# Patient Record
Sex: Male | Born: 1965 | Race: Black or African American | Hispanic: No | Marital: Married | Smoking: Never smoker
Health system: Southern US, Community
[De-identification: ages and names within clinical notes are randomized; demographics above are authoritative.]

## PROBLEM LIST (undated history)

## (undated) DIAGNOSIS — H11009 Unspecified pterygium of unspecified eye: Secondary | ICD-10-CM

## (undated) HISTORY — PX: HEMORROIDECTOMY: SUR656

## (undated) HISTORY — PX: EYE SURGERY: SHX253

## (undated) HISTORY — PX: PTERYGIUM EXCISION: SHX2273

---

## 2008-08-17 ENCOUNTER — Ambulatory Visit: Payer: Self-pay | Admitting: Internal Medicine

## 2008-08-17 DIAGNOSIS — R141 Gas pain: Secondary | ICD-10-CM

## 2008-08-17 DIAGNOSIS — K644 Residual hemorrhoidal skin tags: Secondary | ICD-10-CM | POA: Insufficient documentation

## 2008-08-17 DIAGNOSIS — R1084 Generalized abdominal pain: Secondary | ICD-10-CM

## 2008-08-17 DIAGNOSIS — R142 Eructation: Secondary | ICD-10-CM

## 2008-08-17 DIAGNOSIS — K5900 Constipation, unspecified: Secondary | ICD-10-CM | POA: Insufficient documentation

## 2008-08-17 DIAGNOSIS — R143 Flatulence: Secondary | ICD-10-CM

## 2008-08-17 DIAGNOSIS — K922 Gastrointestinal hemorrhage, unspecified: Secondary | ICD-10-CM | POA: Insufficient documentation

## 2008-09-01 ENCOUNTER — Ambulatory Visit: Payer: Self-pay | Admitting: Internal Medicine

## 2009-04-30 ENCOUNTER — Ambulatory Visit: Payer: Self-pay | Admitting: Internal Medicine

## 2009-04-30 DIAGNOSIS — K625 Hemorrhage of anus and rectum: Secondary | ICD-10-CM

## 2009-05-16 ENCOUNTER — Emergency Department (HOSPITAL_COMMUNITY): Admission: EM | Admit: 2009-05-16 | Discharge: 2009-05-16 | Payer: Self-pay | Admitting: Emergency Medicine

## 2009-06-21 ENCOUNTER — Encounter: Admission: RE | Admit: 2009-06-21 | Discharge: 2009-06-21 | Payer: Self-pay | Admitting: Internal Medicine

## 2010-07-28 IMAGING — CR DG LUMBAR SPINE 2-3V
3 series · 3 of 3 positions shown · non-contrast
Comparison: None

CLINICAL DATA: Low back pain for 5 days, no acute injury

LUMBAR SPINE - 2-3 VIEW

[view not recorded (1 of 3)]
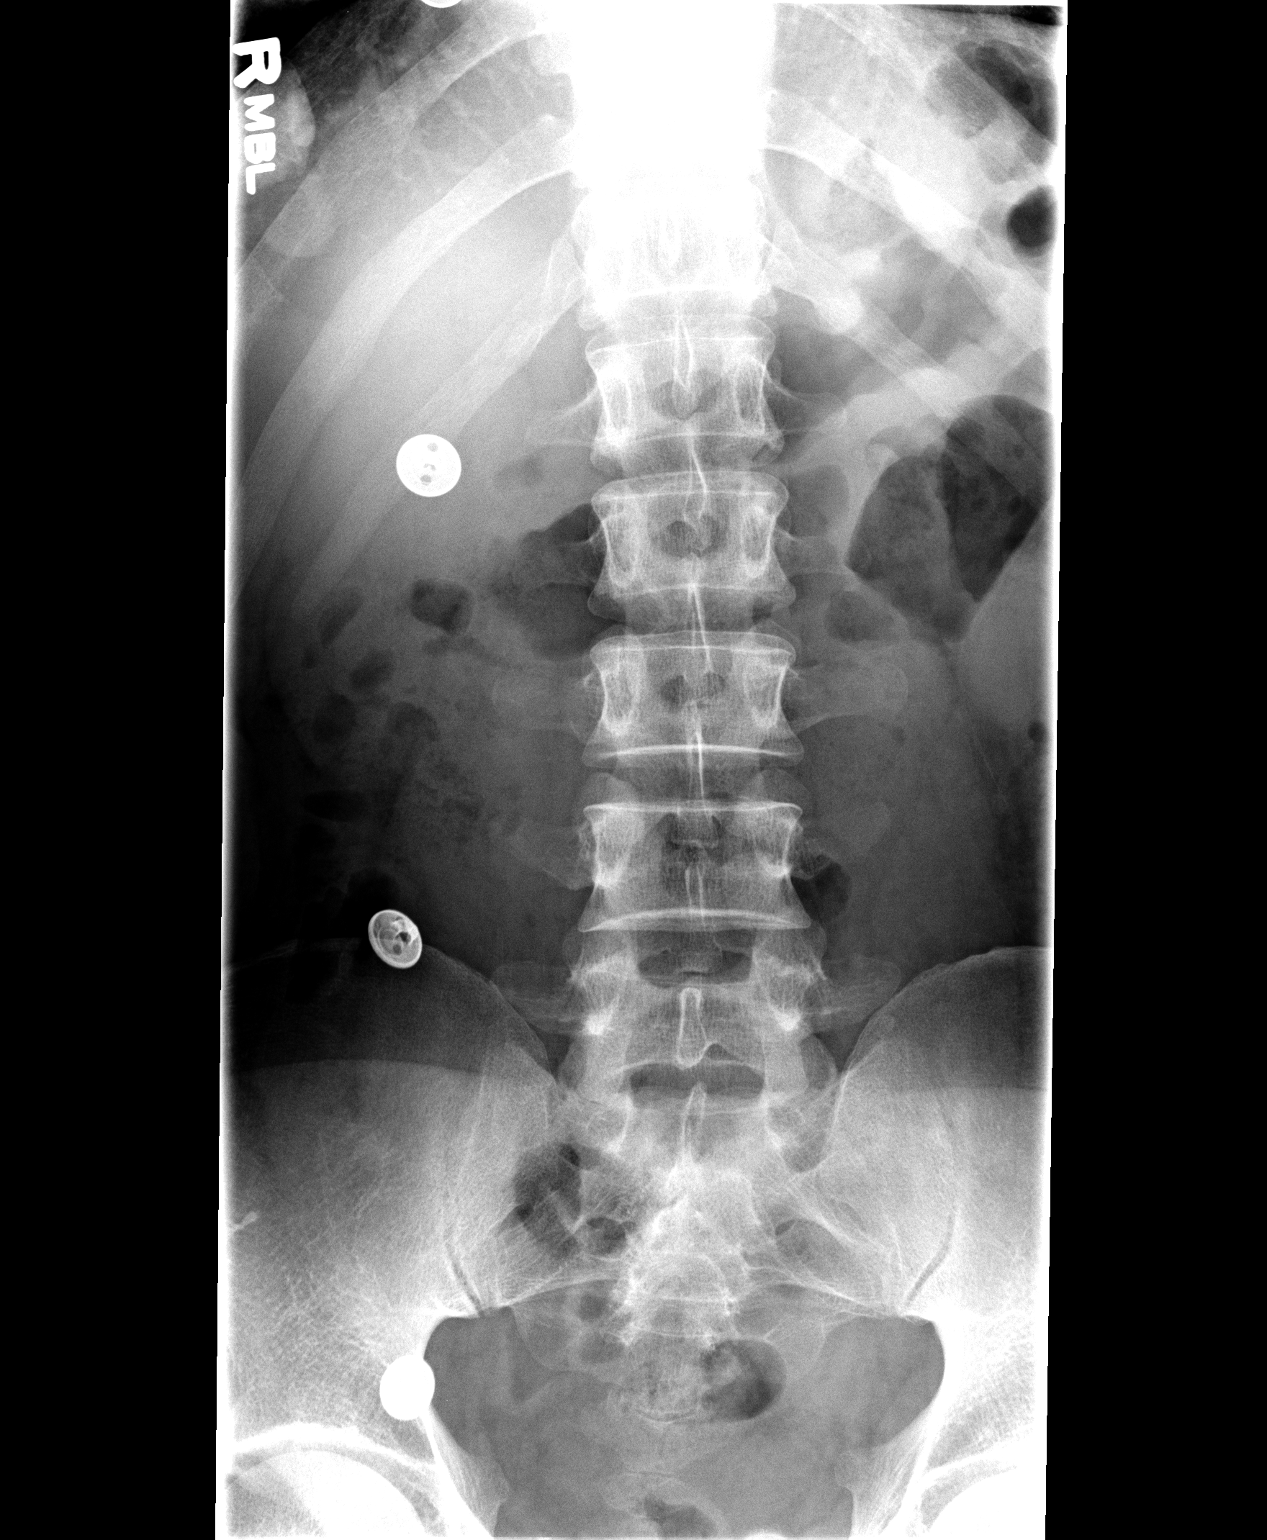

[view not recorded (2 of 3)]
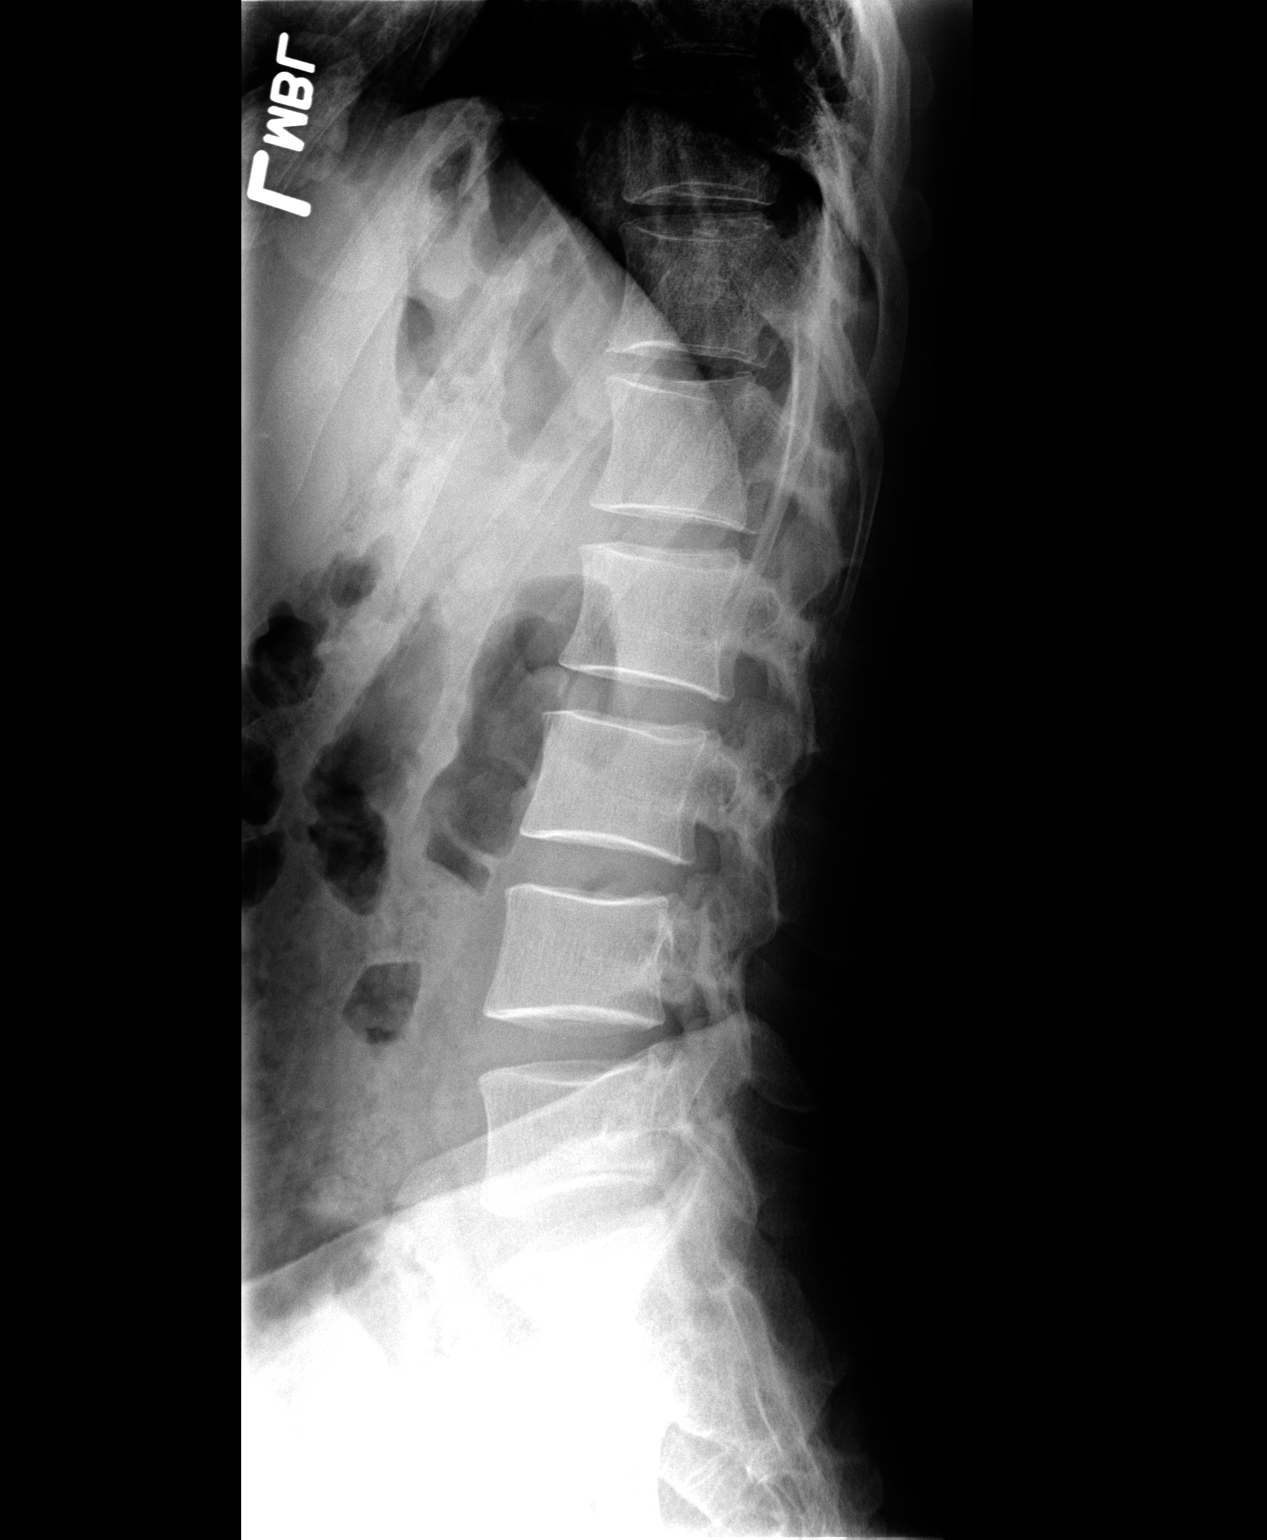

[view not recorded (3 of 3)]
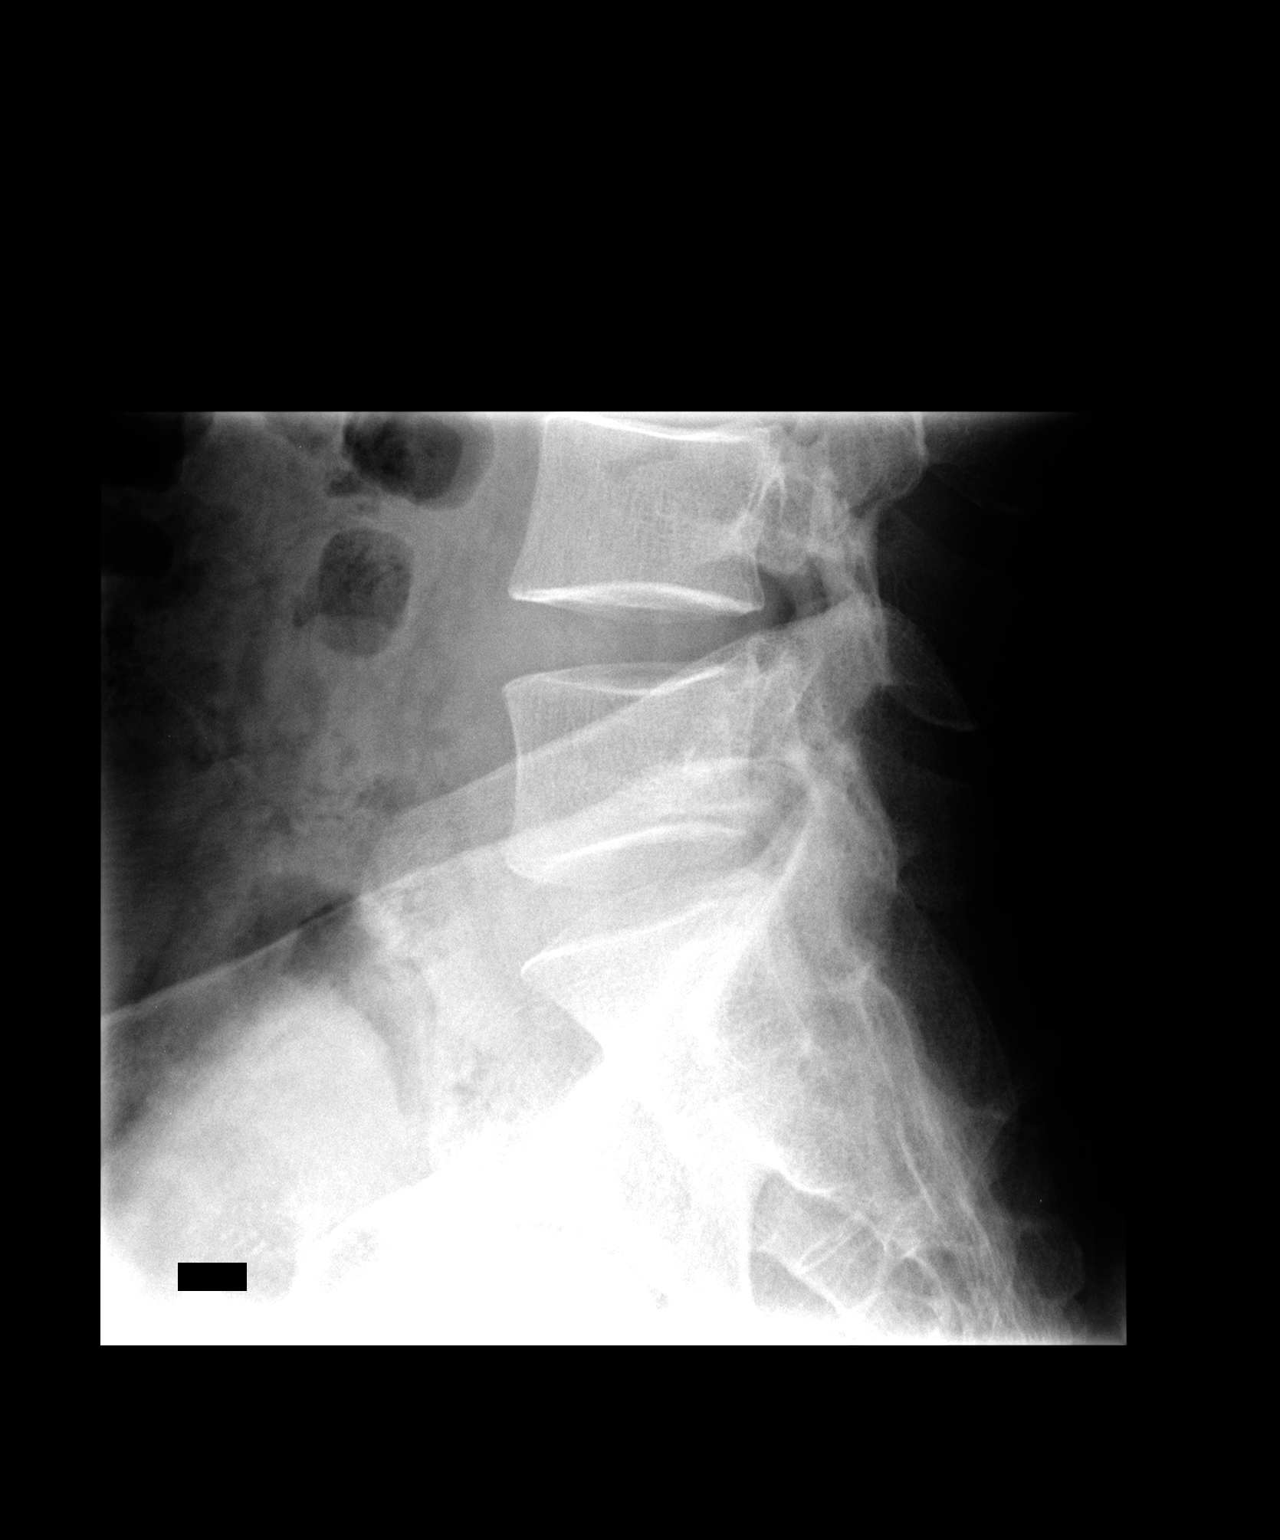

[3 of 3 positions shown; findings below may reference images not displayed]

FINDINGS: The lumbar vertebrae are in normal alignment with normal
intervertebral disc spaces.  No compression deformity is seen.  The
SI joints appear normally corticated.
IMPRESSION: Normal alignment with no acute abnormality.

## 2011-01-15 LAB — CBC
HCT: 44.8 % (ref 39.0–52.0)
MCV: 83.4 fL (ref 78.0–100.0)
Platelets: 172 10*3/uL (ref 150–400)
RDW: 14 % (ref 11.5–15.5)

## 2011-01-15 LAB — POCT I-STAT, CHEM 8
HCT: 48 % (ref 39.0–52.0)
Hemoglobin: 16.3 g/dL (ref 13.0–17.0)
Sodium: 140 mEq/L (ref 135–145)
TCO2: 29 mmol/L (ref 0–100)

## 2011-01-15 LAB — DIFFERENTIAL
Basophils Absolute: 0 10*3/uL (ref 0.0–0.1)
Basophils Relative: 1 % (ref 0–1)
Eosinophils Relative: 7 % — ABNORMAL HIGH (ref 0–5)
Monocytes Absolute: 0.6 10*3/uL (ref 0.1–1.0)
Neutro Abs: 1.9 10*3/uL (ref 1.7–7.7)

## 2011-01-15 LAB — POCT URINALYSIS DIP (DEVICE)
Protein, ur: NEGATIVE mg/dL
Urobilinogen, UA: 0.2 mg/dL (ref 0.0–1.0)

## 2013-04-22 ENCOUNTER — Other Ambulatory Visit: Payer: Self-pay | Admitting: Ophthalmology

## 2013-04-23 ENCOUNTER — Emergency Department (HOSPITAL_COMMUNITY)
Admission: EM | Admit: 2013-04-23 | Discharge: 2013-04-23 | Disposition: A | Discharge status: Home / Self Care | Payer: Managed Care, Other (non HMO) | Attending: Emergency Medicine | Admitting: Emergency Medicine

## 2013-04-23 ENCOUNTER — Encounter (HOSPITAL_COMMUNITY): Payer: Self-pay | Admitting: *Deleted

## 2013-04-23 DIAGNOSIS — Z79899 Other long term (current) drug therapy: Secondary | ICD-10-CM | POA: Insufficient documentation

## 2013-04-23 DIAGNOSIS — H53149 Visual discomfort, unspecified: Secondary | ICD-10-CM | POA: Insufficient documentation

## 2013-04-23 DIAGNOSIS — Z9889 Other specified postprocedural states: Secondary | ICD-10-CM | POA: Insufficient documentation

## 2013-04-23 DIAGNOSIS — IMO0002 Reserved for concepts with insufficient information to code with codable children: Secondary | ICD-10-CM | POA: Insufficient documentation

## 2013-04-23 DIAGNOSIS — R55 Syncope and collapse: Secondary | ICD-10-CM | POA: Insufficient documentation

## 2013-04-23 DIAGNOSIS — R42 Dizziness and giddiness: Secondary | ICD-10-CM | POA: Insufficient documentation

## 2013-04-23 DIAGNOSIS — H5789 Other specified disorders of eye and adnexa: Secondary | ICD-10-CM | POA: Insufficient documentation

## 2013-04-23 DIAGNOSIS — Z8669 Personal history of other diseases of the nervous system and sense organs: Secondary | ICD-10-CM | POA: Insufficient documentation

## 2013-04-23 HISTORY — DX: Unspecified pterygium of unspecified eye: H11.009

## 2013-04-23 LAB — GLUCOSE, CAPILLARY: Glucose-Capillary: 93 mg/dL (ref 70–99)

## 2013-04-23 LAB — POCT I-STAT, CHEM 8
Calcium, Ion: 1.22 mmol/L (ref 1.12–1.23)
Chloride: 100 mEq/L (ref 96–112)
Glucose, Bld: 94 mg/dL (ref 70–99)
HCT: 49 % (ref 39.0–52.0)
Hemoglobin: 16.7 g/dL (ref 13.0–17.0)

## 2013-04-23 MED ORDER — IBUPROFEN 200 MG PO TABS
600.0000 mg | ORAL_TABLET | Freq: Once | ORAL | Status: AC
  Administered 2013-04-23: 600 mg via ORAL
  Filled 2013-04-23: qty 3

## 2013-04-23 NOTE — ED Notes (Signed)
Patient resting.  Denies any complaints except for being hungry.  Will inform MD

## 2013-04-23 NOTE — ED Notes (Signed)
Patient was at [facility] md for follow up today post pterygium left eye surgery.  He had MAC anesthesia for surgery on yesterday.  Patient went to MD as scheduled for follow up and had onset of feeling light headed and had a brief period of loc.  Patient was given soda and asprin 325mg .  Patient cbg 90 per ems.  bp 123/83.  No ortho changes reported.  Patient has baseline of bradycardia.  Robert Simpson

## 2013-04-23 NOTE — ED Provider Notes (Signed)
History    CSN: [id number] Arrival date & time 04/23/13  1114  First MD Initiated Contact with Patient 04/23/13 1131     Chief Complaint  Patient presents with  . Loss of Consciousness   (Consider location/radiation/quality/duration/timing/severity/associated sxs/prior Treatment) HPI Pt with pterygium surgery yesterday and was following up with Ophthalmologist today when he had syncopal episode proceeded by lightheadedness. Pt was amnestic to the event. No head or neck trauma. No CP, SOB, focal weakness or numbness. Pt states he feels at his baseline currently. He had similar episode 20 year ago.  Past Medical History  Diagnosis Date  . Pterygium    Past Surgical History  Procedure Laterality Date  . Pterygium excision     No family history on file. History  Substance Use Topics  . Smoking status: Never Smoker   . Smokeless tobacco: Not on file  . Alcohol Use: No    Review of Systems  Constitutional: Negative for fever and chills.  HENT: Negative for neck pain.   Eyes: Positive for photophobia and redness.  Respiratory: Negative for shortness of breath.   Cardiovascular: Negative for chest pain, palpitations and leg swelling.  Gastrointestinal: Negative for nausea, vomiting and abdominal pain.  Musculoskeletal: Negative for myalgias and back pain.  Skin: Negative for wound.  Neurological: Positive for dizziness, syncope and light-headedness. Negative for seizures, weakness, numbness and headaches.  All other systems reviewed and are negative.    Allergies  Review of patient's allergies indicates no known allergies.  Home Medications   Current Outpatient Rx  Name  Route  Sig  Dispense  Refill  . cholecalciferol (VITAMIN D) 1000 UNITS tablet   Oral   Take 1,000 Units by mouth daily.         Marland Kitchen HYDROcodone-acetaminophen (NORCO/VICODIN) 5-325 MG per tablet   Oral   Take 1 tablet by mouth every 6 (six) hours as needed for pain.         Marland Kitchen moxifloxacin  (VIGAMOX) 0.5 % ophthalmic solution   Left Eye   Place 1 drop into the left eye 4 (four) times daily - after meals and at bedtime.         . prednisoLONE sodium phosphate (INFLAMASE FORTE) 1 % ophthalmic solution   Left Eye   Place 1 drop into the left eye 4 (four) times daily.         Marland Kitchen tobramycin (TOBREX) 0.3 % ophthalmic ointment   Left Eye   Place 1 application into the left eye at bedtime.          BP 119/75  Pulse 60  Temp(Src) 98.3 F (36.8 C) (Oral)  Resp 20  Ht 6\' 2"  (1.88 m)  Wt 163 lb (73.936 kg)  BMI 20.92 kg/m2  SpO2 99% Physical Exam  Nursing note and vitals reviewed. Constitutional: He is oriented to person, place, and time. He appears well-developed and well-nourished. No distress.  HENT:  Head: Normocephalic and atraumatic.  Mouth/Throat: Oropharynx is clear and moist.  Eyes: EOM are normal. Pupils are equal, round, and reactive to light.  Injected L eye, with direct and consensual photophobia.   Neck: Normal range of motion. Neck supple.  No posterior cervical tenderness  Cardiovascular: Normal rate and regular rhythm.   Pulmonary/Chest: Effort normal and breath sounds normal. No respiratory distress. He has no wheezes. He has no rales.  Abdominal: Soft. Bowel sounds are normal. He exhibits no distension and no mass. There is no tenderness. There is no rebound and no  guarding.  Musculoskeletal: Normal range of motion. He exhibits no edema and no tenderness.  No calf swelling or tenderness  Neurological: He is alert and oriented to person, place, and time.  5/5 motor in all ext, sensation intact  Skin: Skin is warm and dry. No rash noted. No erythema.  Psychiatric: He has a normal mood and affect. His behavior is normal.    ED Course  Procedures (including critical care time) Labs Reviewed  GLUCOSE, CAPILLARY  TROPONIN I  POCT I-STAT, CHEM 8  POCT I-STAT TROPONIN I   No results found. 1. Syncope     Date: 04/23/2013  Rate: 50  Rhythm:  sinus bradycardia  QRS Axis: normal  Intervals: normal  ST/T Wave abnormalities: early repolarization and diffuse ST seg elevation consistent with J point elevation vs pericarditis.   Conduction Disutrbances:none  Narrative Interpretation:   Old EKG Reviewed: none available   MDM  Discussed with cardiology who reviewed EKG. Think pattern is more consistent with early repol type pattern. Recommend out pt f/u with no acute intervention.   Loren Racer, MD 04/23/13 1537

## 2016-02-07 ENCOUNTER — Encounter: Payer: Self-pay | Admitting: Internal Medicine

## 2017-05-08 DIAGNOSIS — M549 Dorsalgia, unspecified: Secondary | ICD-10-CM | POA: Insufficient documentation

## 2017-05-09 ENCOUNTER — Emergency Department (HOSPITAL_BASED_OUTPATIENT_CLINIC_OR_DEPARTMENT_OTHER): Payer: 59

## 2017-05-09 ENCOUNTER — Encounter (HOSPITAL_COMMUNITY): Payer: Self-pay | Admitting: *Deleted

## 2017-05-09 ENCOUNTER — Encounter (HOSPITAL_BASED_OUTPATIENT_CLINIC_OR_DEPARTMENT_OTHER): Payer: Self-pay

## 2017-05-09 ENCOUNTER — Emergency Department (HOSPITAL_BASED_OUTPATIENT_CLINIC_OR_DEPARTMENT_OTHER)
Admission: EM | Admit: 2017-05-09 | Discharge: 2017-05-09 | Disposition: A | Discharge status: Home / Self Care | Payer: 59 | Attending: Emergency Medicine | Admitting: Emergency Medicine

## 2017-05-09 DIAGNOSIS — M545 Low back pain, unspecified: Secondary | ICD-10-CM

## 2017-05-09 MED ORDER — PREDNISONE 10 MG (21) PO TBPK: ORAL_TABLET | ORAL | 0 refills | Status: DC

## 2017-05-09 MED ORDER — OXYCODONE-ACETAMINOPHEN 5-325 MG PO TABS: 1.0000 | ORAL_TABLET | Freq: Four times a day (QID) | ORAL | 0 refills | Status: AC | PRN

## 2017-05-09 MED ORDER — IBUPROFEN 800 MG PO TABS
800.0000 mg | ORAL_TABLET | Freq: Once | ORAL | Status: AC
  Administered 2017-05-09: 800 mg via ORAL
  Filled 2017-05-09: qty 1

## 2017-05-09 MED ORDER — OXYCODONE-ACETAMINOPHEN 5-325 MG PO TABS
2.0000 | ORAL_TABLET | Freq: Once | ORAL | Status: AC
  Administered 2017-05-09: 2 via ORAL
  Filled 2017-05-09: qty 2

## 2017-05-09 MED ORDER — IBUPROFEN 800 MG PO TABS: 800.0000 mg | ORAL_TABLET | Freq: Three times a day (TID) | ORAL | 0 refills | Status: AC | PRN

## 2017-05-09 MED ORDER — PREDNISONE 50 MG PO TABS
60.0000 mg | ORAL_TABLET | Freq: Once | ORAL | Status: AC
  Administered 2017-05-09: 02:00:00 60 mg via ORAL
  Filled 2017-05-09: qty 1

## 2017-05-09 NOTE — ED Provider Notes (Signed)
TIME SEEN: 2:15 AM  CHIEF COMPLAINT: Back pain  HPI: Patient is a 51 year old male who presents emergency department with aching back pain worse with movement and walking that started at 8:30 PM tonight. Described as severe without radiation. No numbness, tingling or focal weakness. No bowel or bladder incontinence. No urinary retention. No fever. No history of cancer. No history of IV drug abuse. No history of back surgery or epidural injections. He denies any known injury or increased physical exertion. Has never had similar symptoms. Did not try any medications prior to arrival. No dysuria, hematuria. No history of kidney stones.  ROS: See HPI Constitutional: no fever  Eyes: no drainage  ENT: no runny nose   Cardiovascular:  no chest pain  Resp: no SOB  GI: no vomiting or diarrhea GU: no dysuria Integumentary: no rash  Allergy: no hives  Musculoskeletal: no leg swelling  Neurological: no slurred speech ROS otherwise negative  PAST MEDICAL HISTORY/PAST SURGICAL HISTORY:  History reviewed. No pertinent past medical history.  MEDICATIONS:  Prior to Admission medications   Medication Sig Start Date End Date Taking? Authorizing Provider  ibuprofen (ADVIL,MOTRIN) 800 MG tablet Take 1 tablet (800 mg total) by mouth every 8 (eight) hours as needed for mild pain. 05/09/17   Ward, Layla MawKristen N, DO  oxyCODONE-acetaminophen (PERCOCET/ROXICET) 5-325 MG tablet Take 1-2 tablets by mouth every 6 (six) hours as needed. 05/09/17   Ward, Layla MawKristen N, DO  predniSONE (STERAPRED UNI-PAK 21 TAB) 10 MG (21) TBPK tablet Take as directed 05/09/17   Ward, Layla MawKristen N, DO    ALLERGIES:  No Known Allergies  SOCIAL HISTORY:  Social History  Substance Use Topics  . Smoking status: Never Smoker  . Smokeless tobacco: Never Used  . Alcohol use No    FAMILY HISTORY: No family history on file.  EXAM: BP 118/72 (BP Location: Left Arm)   Pulse (!) 54   Temp 98.3 F (36.8 C) (Oral)   Resp 16   Ht 6\' 2"  (1.88 m)    Wt 78 kg (172 lb)   SpO2 100%   BMI 22.08 kg/m  CONSTITUTIONAL: Alert and oriented and responds appropriately to questions. Well-appearing; well-nourished HEAD: Normocephalic EYES: Conjunctivae clear, pupils appear equal, EOMI ENT: normal nose; moist mucous membranes NECK: Supple, no meningismus, no nuchal rigidity, no LAD  CARD: RRR; S1 and S2 appreciated; no murmurs, no clicks, no rubs, no gallops RESP: Normal chest excursion without splinting or tachypnea; breath sounds clear and equal bilaterally; no wheezes, no rhonchi, no rales, no hypoxia or respiratory distress, speaking full sentences ABD/GI: Normal bowel sounds; non-distended; soft, non-tender, no rebound, no guarding, no peritoneal signs, no hepatosplenomegaly BACK:  The back appears normal and is tender over the lower lumbar spine without step-off or deformity. There is no erythema, warmth or swelling or ecchymosis noted. There is no CVA tenderness EXT: Normal ROM in all joints; non-tender to palpation; no edema; normal capillary refill; no cyanosis, no calf tenderness or swelling    SKIN: Normal color for age and race; warm; no rash NEURO: Moves all extremities equally, normal gait, sensation to light touch intact diffusely, no saddle anesthesia, 2+ deep tendon reflexes in bilateral lower extremities, no clonus, negative straight leg raise PSYCH: The patient's mood and manner are appropriate. Grooming and personal hygiene are appropriate.  MEDICAL DECISION MAKING: Patient here with complaints of back pain. X-ray obtained in triage is unremarkable. He is neurologically intact this time. Nothing at this time to suggest cauda equina, spinal stenosis,  epidural abscess or hematoma, discitis, transverse myelitis. I do not feel he needs emergent MRI of his back but we have discussed that if his pain is not improving with medical management that he should follow-up with a primary care physician closely who may recommend further outpatient  imaging. We'll discharge on a steroid taper and with prescriptions for ibuprofen and Percocet. Discussed at length return precautions with patient and his wife. They're both comfortable with this plan. His wife or be driving him home.  At this time, I do not feel there is any life-threatening condition present. I have reviewed and discussed all results (EKG, imaging, lab, urine as appropriate) and exam findings with patient/family. I have reviewed nursing notes and appropriate previous records.  I feel the patient is safe to be discharged home without further emergent workup and can continue workup as an outpatient as needed. Discussed usual and customary return precautions. Patient/family verbalize understanding and are comfortable with this plan.  Outpatient follow-up has been provided if needed. All questions have been answered.     Ward, Layla MawKristen N, DO 05/09/17 [phone]

## 2017-05-09 NOTE — ED Triage Notes (Addendum)
Pt c/o midline, lower back pain since this evening worse with movement, denies injury, denies incontinence, pt took 1500mg  tylenol prior to arrival

## 2017-05-09 NOTE — ED Notes (Signed)
Pt verbalizes understanding of d/c instructions and denies any further needs at this time. 

## 2017-05-09 NOTE — Discharge Instructions (Signed)
To find a primary care or specialty doctor please call [phone] or [phone] to access "[place] Find a Doctor Service." ° °You may also go on the [facility] website at [email or web address] ° °There are also multiple [facility], [facility] and [facility] practices throughout the [place] that are frequently accepting new patients. You may find a clinic that is close to your home and contact them. ° °[facility] -  °[address] °[place] [address] °[phone] ° ° °[facility] -  °[address] °[place] [address] °[phone] ° ° °[facility] - °[address]  °[place] [address] °[phone] ° ° °

## 2017-05-09 NOTE — ED Notes (Signed)
Patient bend over to pick up the mat and he stated that he did not even pick up the mat because his back was in pain.

## 2020-01-16 ENCOUNTER — Ambulatory Visit: Payer: Medicaid Other | Attending: Internal Medicine

## 2020-01-16 DIAGNOSIS — Z23 Encounter for immunization: Secondary | ICD-10-CM

## 2020-01-16 NOTE — Progress Notes (Signed)
   Covid-19 Vaccination Clinic  Name:  Robert Simpson    MRN: [id number] DOB: 12-25-1965  01/16/2020  [name] was observed post Covid-19 immunization for 15 minutes without incident. He was provided with Vaccine Information Sheet and instruction to access the V-Safe system.   [name] was instructed to call 911 with any severe reactions post vaccine: Marland Kitchen Difficulty breathing  . Swelling of face and throat  . A fast heartbeat  . A bad rash all over body  . Dizziness and weakness   Immunizations Administered    Name Date Dose VIS Date Route   Pfizer COVID-19 Vaccine 01/16/2020  1:58 PM 0.3 mL 09/19/2019 Intramuscular   Manufacturer: [employer], [employer]   Lot: [id number]   NDC: [address]-8

## 2020-02-23 ENCOUNTER — Ambulatory Visit: Payer: Medicaid Other | Attending: Internal Medicine

## 2022-05-18 ENCOUNTER — Ambulatory Visit: Payer: Medicaid Other | Admitting: Family Medicine

## 2022-05-25 ENCOUNTER — Encounter: Payer: Self-pay | Admitting: Family Medicine

## 2022-05-25 ENCOUNTER — Ambulatory Visit (INDEPENDENT_AMBULATORY_CARE_PROVIDER_SITE_OTHER): Payer: Medicaid Other | Admitting: Family Medicine

## 2022-05-25 ENCOUNTER — Ambulatory Visit (HOSPITAL_BASED_OUTPATIENT_CLINIC_OR_DEPARTMENT_OTHER)
Admission: RE | Admit: 2022-05-25 | Discharge: 2022-05-25 | Disposition: A | Discharge status: Home / Self Care | Payer: Medicaid Other | Source: Ambulatory Visit | Attending: Family Medicine | Admitting: Family Medicine

## 2022-05-25 VITALS — BP 124/83 | Ht 74.0 in | Wt 172.0 lb

## 2022-05-25 DIAGNOSIS — S161XXA Strain of muscle, fascia and tendon at neck level, initial encounter: Secondary | ICD-10-CM

## 2022-05-25 DIAGNOSIS — M7712 Lateral epicondylitis, left elbow: Secondary | ICD-10-CM | POA: Diagnosis present

## 2022-05-25 MED ORDER — PREDNISONE 5 MG PO TABS: ORAL_TABLET | ORAL | 0 refills | Status: AC

## 2022-05-25 MED ORDER — NITROGLYCERIN 0.2 MG/HR TD PT24: MEDICATED_PATCH | TRANSDERMAL | 11 refills | Status: AC

## 2022-05-25 NOTE — Patient Instructions (Signed)
Nice to meet you Please try heat on your neck  Please try the exercises  I will call with the xray results.  Please try the nitro patches and let me know if you tolerate   Please send me a message in MyChart with any questions or updates.  Please see me back in 3-4 weeks.   --Dr. Jordan Likes  Nitroglycerin Protocol  Apply 1/4 nitroglycerin patch to affected area daily. Change position of patch within the affected area every 24 hours. You may experience a headache during the first 1-2 weeks of using the patch, these should subside. If you experience headaches after beginning nitroglycerin patch treatment, you may take your preferred over the counter pain reliever. Another side effect of the nitroglycerin patch is skin irritation or rash related to patch adhesive. Please notify our office if you develop more severe headaches or rash, and stop the patch. Tendon healing with nitroglycerin patch may require 12 to 24 weeks depending on the extent of injury. Men should not use if taking Viagra, Cialis, or Levitra.  Do not use if you have migraines or rosacea.

## 2022-05-25 NOTE — Progress Notes (Signed)
  Dwain Jahnke - 56 y.o. male MRN [id number]  Date of birth: 1965/12/25  SUBJECTIVE:  Including CC & ROS.  No chief complaint on file.   Marlowe Davidow is a 56 y.o. male that is presenting with neck pain and left elbow pain.  These are acute on chronic in nature.  His left elbow pain has been occurring over the lateral epicondyle for about 4 to 5 months.  He drives a truck for work.  He also experiences limited extension of the neck.  Having pain in the midline as well as in the periscapular region.  Has not tried any modalities for therapy.   Review of Systems See HPI   HISTORY: Past Medical, Surgical, Social, and Family History Reviewed & Updated per EMR.   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  Symptoms most consistent with lateral condylitis. -Counseled on home exercise therapy and supportive care. -Nitro patches. -Could consider injection or shockwave therapy.  Cervical strain Acute on chronic in nature.  Having limited extension.  Pain is more midline with periscapular pain. -Counseled on home exercise therapy and supportive care. -X-ray. -Prednisone. -Could consider trigger point injections or physical therapy.

## 2022-05-25 NOTE — Assessment & Plan Note (Signed)
Acute on chronic in nature.  Symptoms most consistent with lateral condylitis. -Counseled on home exercise therapy and supportive care. -Nitro patches. -Could consider injection or shockwave therapy.

## 2022-05-25 NOTE — Assessment & Plan Note (Signed)
Acute on chronic in nature.  Having limited extension.  Pain is more midline with periscapular pain. -Counseled on home exercise therapy and supportive care. -X-ray. -Prednisone. -Could consider trigger point injections or physical therapy.

## 2022-05-29 ENCOUNTER — Telehealth: Payer: Self-pay | Admitting: Family Medicine

## 2022-05-29 NOTE — Telephone Encounter (Signed)
Left VM for patient. If he calls back please have him speak with a nurse/CMA and inform that his xrays are normal.   If any questions then please take the best time and phone number to call and I will try to call him back.   Myra Rude, MD [facility] 05/29/2022, 1:26 PM

## 2022-05-30 NOTE — Telephone Encounter (Signed)
Pt informed of below.  

## 2022-06-13 ENCOUNTER — Other Ambulatory Visit: Payer: Self-pay | Admitting: Family Medicine

## 2022-06-13 DIAGNOSIS — M7712 Lateral epicondylitis, left elbow: Secondary | ICD-10-CM

## 2022-06-22 ENCOUNTER — Ambulatory Visit: Payer: Medicaid Other | Admitting: Family Medicine

## 2023-01-22 ENCOUNTER — Encounter: Payer: Self-pay | Admitting: *Deleted
